# Patient Record
Sex: Female | Born: 2000 | Race: Black or African American | Hispanic: No | Marital: Single | Smoking: Never smoker
Health system: Southern US, Community
[De-identification: ages and names within clinical notes are randomized; demographics above are authoritative.]

---
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
## 2018-09-12 ENCOUNTER — Emergency Department (HOSPITAL_COMMUNITY)
Admission: EM | Admit: 2018-09-12 | Discharge: 2018-09-12 | Discharge status: Against Medical Advice | Payer: Medicaid Other

## 2018-09-12 NOTE — ED Notes (Signed)
Called for triage, no answer

## 2019-04-29 ENCOUNTER — Other Ambulatory Visit: Payer: Self-pay | Admitting: Emergency Medicine

## 2019-04-29 DIAGNOSIS — Z20822 Contact with and (suspected) exposure to covid-19: Secondary | ICD-10-CM

## 2019-04-30 LAB — NOVEL CORONAVIRUS, NAA: SARS-CoV-2, NAA: NOT DETECTED

## 2021-04-11 ENCOUNTER — Emergency Department (HOSPITAL_COMMUNITY)
Admission: EM | Admit: 2021-04-11 | Discharge: 2021-04-11 | Disposition: A | Discharge status: Home / Self Care | Payer: Medicaid Other | Attending: Physician Assistant | Admitting: Physician Assistant

## 2021-04-11 ENCOUNTER — Emergency Department (HOSPITAL_COMMUNITY): Payer: Medicaid Other

## 2021-04-11 ENCOUNTER — Other Ambulatory Visit: Payer: Self-pay

## 2021-04-11 DIAGNOSIS — Z5321 Procedure and treatment not carried out due to patient leaving prior to being seen by health care provider: Secondary | ICD-10-CM | POA: Diagnosis not present

## 2021-04-11 DIAGNOSIS — M25522 Pain in left elbow: Secondary | ICD-10-CM | POA: Diagnosis not present

## 2021-04-11 DIAGNOSIS — M79602 Pain in left arm: Secondary | ICD-10-CM | POA: Diagnosis present
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
## 2021-04-11 NOTE — ED Notes (Signed)
Pt left. 
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
## 2023-11-10 DIAGNOSIS — S161XXA Strain of muscle, fascia and tendon at neck level, initial encounter: Secondary | ICD-10-CM | POA: Diagnosis not present

## 2023-11-10 DIAGNOSIS — S40012A Contusion of left shoulder, initial encounter: Secondary | ICD-10-CM | POA: Diagnosis not present
